# Patient Record
Sex: Male | Born: 1945 | Race: White | Hispanic: No
Health system: Southern US, Community
[De-identification: ages and names within clinical notes are randomized; demographics above are authoritative.]

---

## 2011-08-02 ENCOUNTER — Encounter: Payer: Self-pay | Admitting: Family Medicine

## 2011-08-29 ENCOUNTER — Encounter: Payer: Self-pay | Admitting: Family Medicine

## 2011-09-28 ENCOUNTER — Encounter: Payer: Self-pay | Admitting: Family Medicine

## 2011-10-27 ENCOUNTER — Ambulatory Visit: Payer: Self-pay | Admitting: Surgery

## 2011-11-09 ENCOUNTER — Ambulatory Visit: Payer: Self-pay | Admitting: Gastroenterology

## 2011-11-11 LAB — PATHOLOGY REPORT

## 2012-03-12 ENCOUNTER — Encounter: Payer: Self-pay | Admitting: Family Medicine

## 2012-03-14 ENCOUNTER — Ambulatory Visit: Payer: Self-pay | Admitting: Family Medicine

## 2012-03-18 ENCOUNTER — Ambulatory Visit: Payer: Self-pay | Admitting: Internal Medicine

## 2012-03-30 ENCOUNTER — Encounter: Payer: Self-pay | Admitting: Family Medicine

## 2012-04-29 ENCOUNTER — Encounter: Payer: Self-pay | Admitting: Family Medicine

## 2014-03-12 IMAGING — CR DG LUMBAR SPINE AP/LAT/OBLIQUES W/ FLEX AND EXT
1 series · 5 of 5 positions shown · non-contrast
Comparison: none

REASON FOR EXAM: pain
COMMENTS:

PROCEDURE:     DXR - DXR LUMBAR SPINE WITH OBLIQUES  - March 14, 2012  [DATE]
RESULT:     Lumbar spine images demonstrate preservation of alignment. The
vertebral body heights appear to be normal. The disc spaces are maintained.
Some L4-L5 and L5-S1 degenerative changes are noted.

[Series 1: t lumbar spine ap · 0.14mm/px · 5 of 5 slices shown]
[im 1/5]
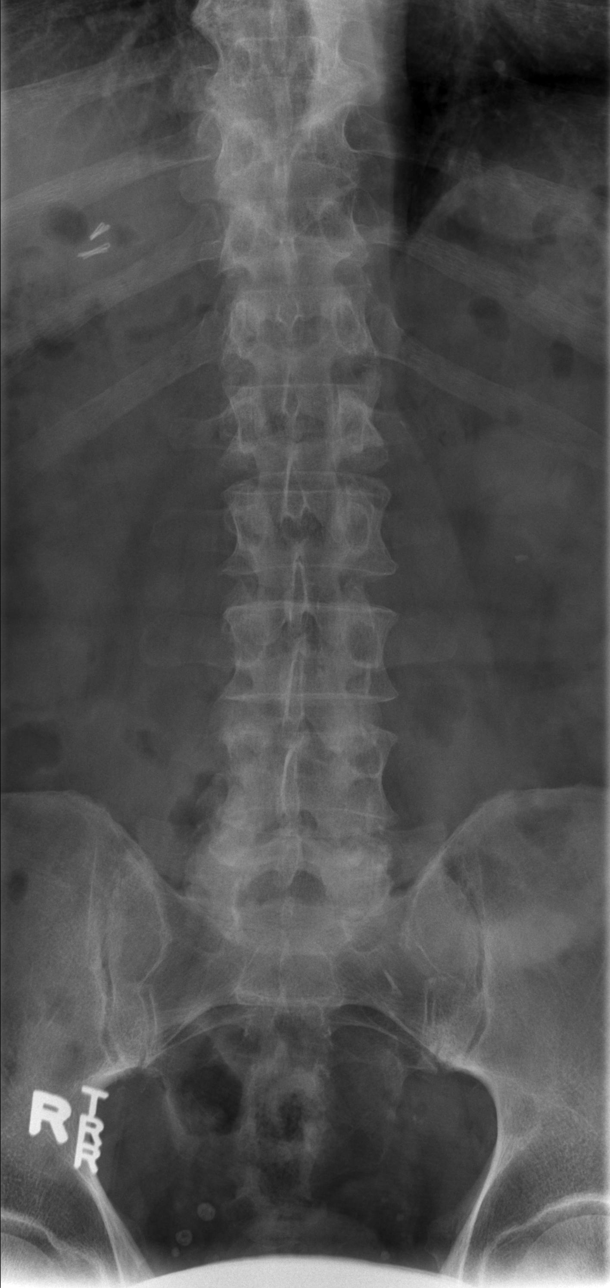
[im 2/5]
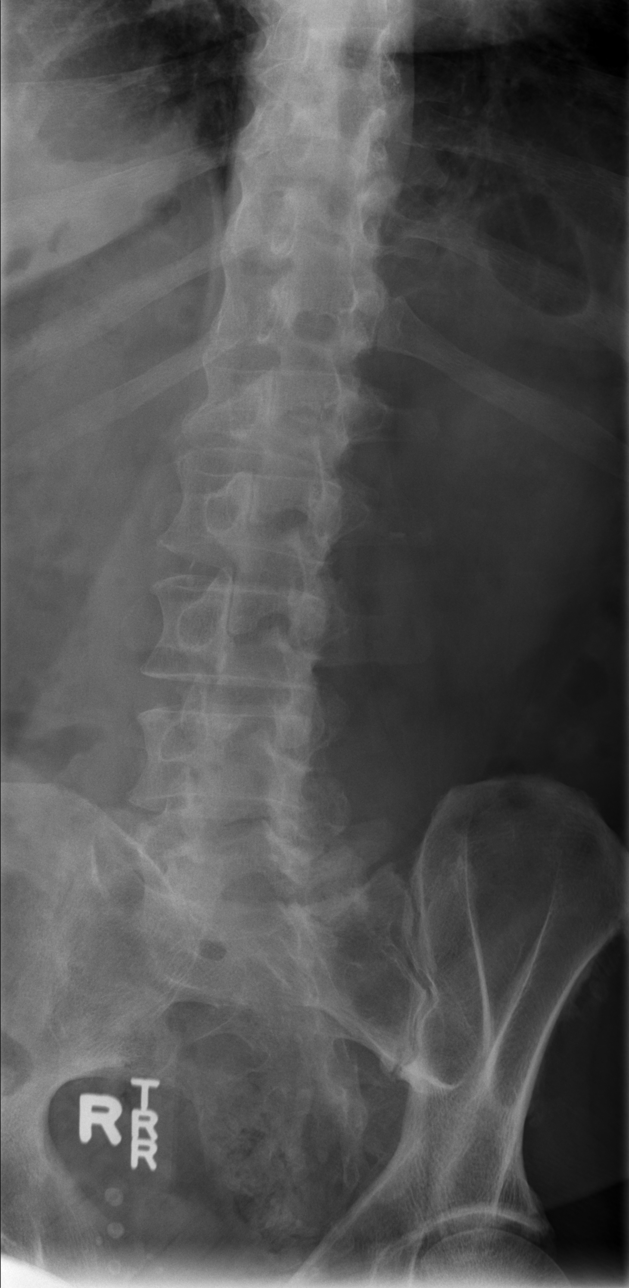
[im 3/5]
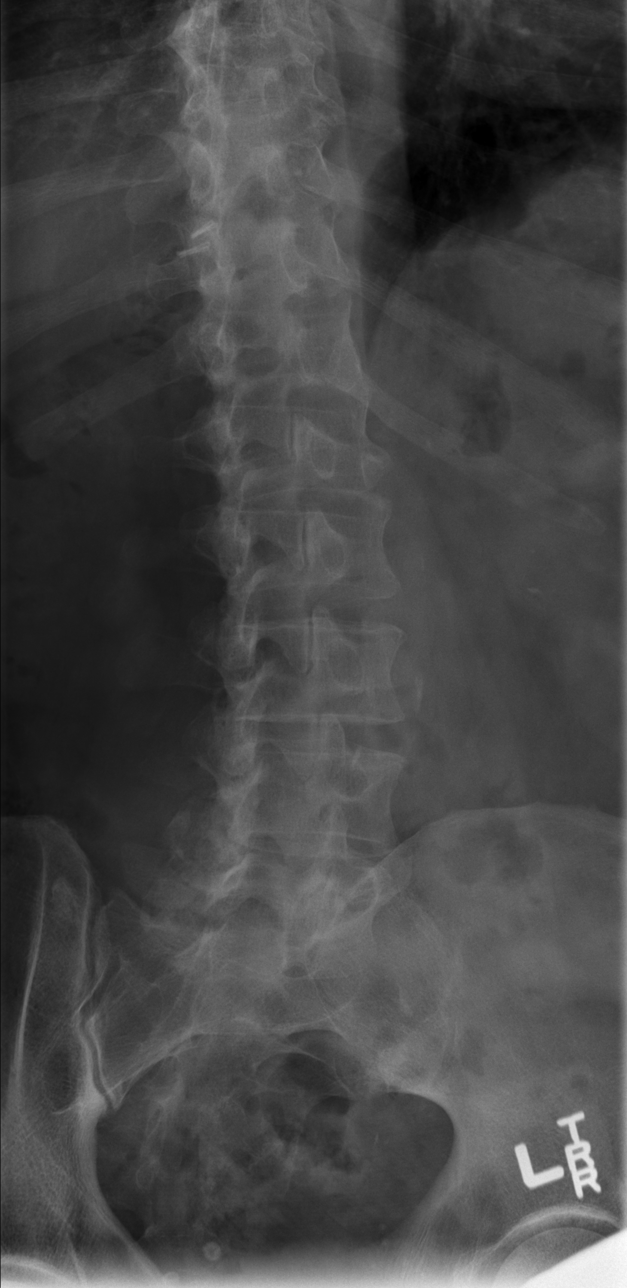
[im 4/5]
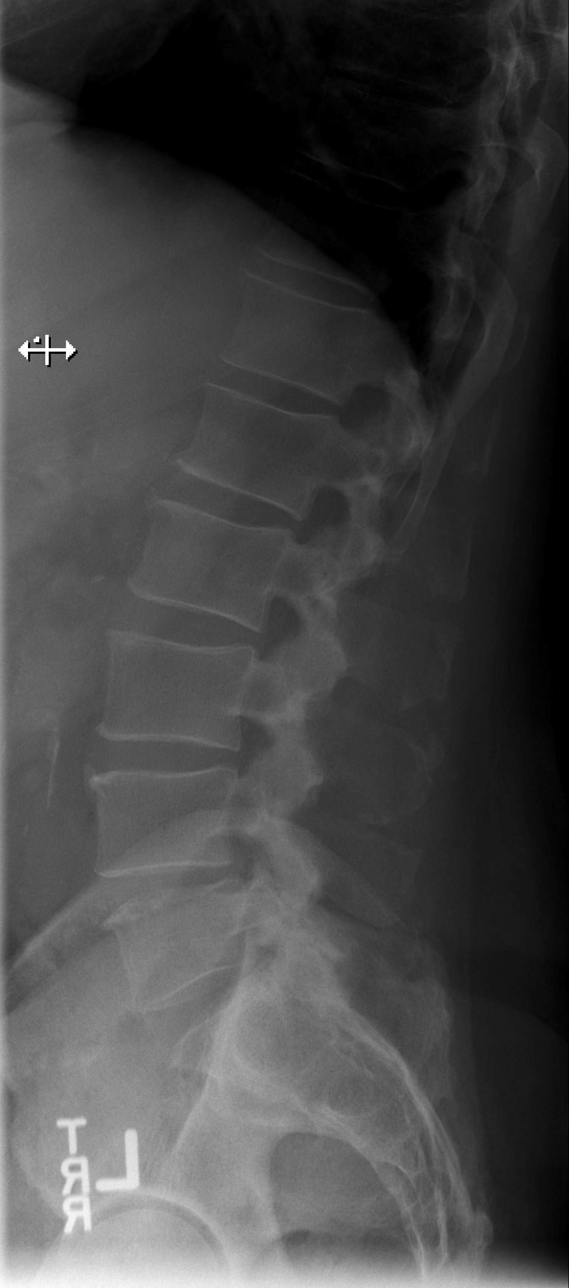
[im 5/5]
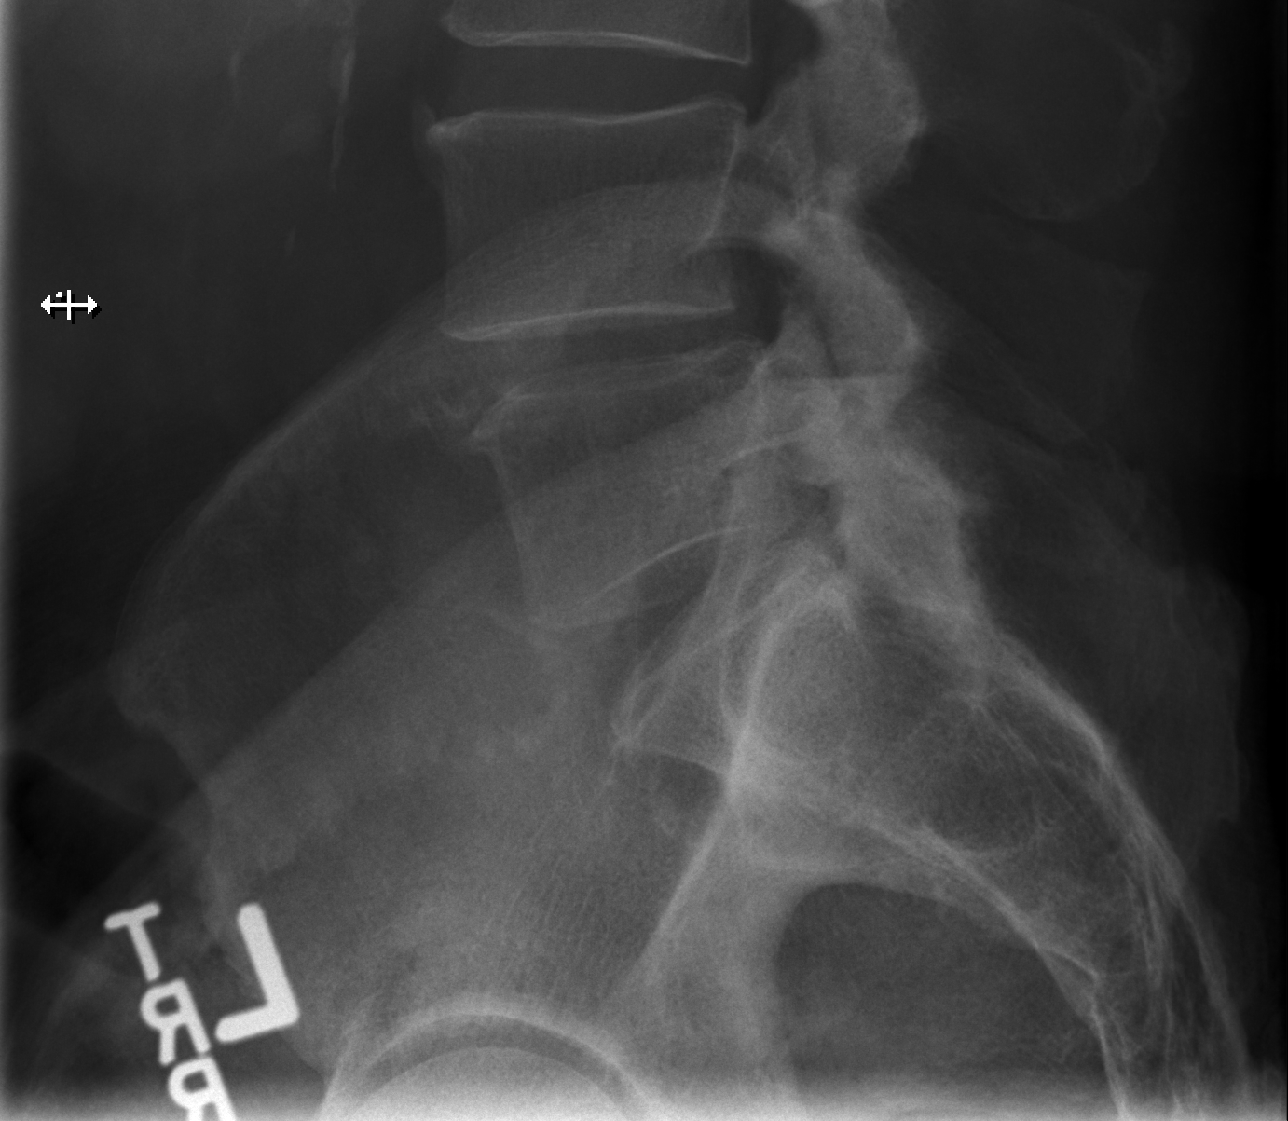

[5 of 5 positions shown; findings below may reference images not displayed]

IMPRESSION: No acute lumbar spine bony abnormality. MRI and CT are
available for for further assessment if desired.

[REDACTED]

## 2014-09-21 NOTE — Op Note (Signed)
PATIENT NAME:  Jaime RuaDOUGLASS, Yomar E MR#:  [id number] DATE OF BIRTH:  [birth year]  DATE OF PROCEDURE:  10/27/2011  PREOPERATIVE DIAGNOSIS: Chronic cholecystitis, cholelithiasis, umbilical hernia.   POSTOPERATIVE DIAGNOSIS: Chronic cholecystitis, cholelithiasis, umbilical hernia.   PROCEDURE: Laparoscopic cholecystectomy, cholangiogram, umbilical hernia repair.   SURGEON: Renda RollsWilton Smith, M.D.   ANESTHESIA: General.   INDICATIONS: This 69 year old male has history of bloating sensation in the abdomen and also has diarrhea for some eight months. He had gastroenterology evaluation. He had ultrasound findings of gallstones. Also, has had steatorrhea and gallbladder surgery was recommended as a part of his treatment. He also had physical findings of an umbilical hernia which was in the upper aspect of the umbilicus and we elected to repair that at the same operative session. We also discussed that laparoscopic cholecystectomy may or may not have any effect on his diarrhea.   DESCRIPTION OF PROCEDURE:  The patient was placed on the operating table in the supine position under general endotracheal anesthesia. The abdomen was prepared with ChloraPrep and draped in a sterile manner. A short incision was made in the upper aspect of the umbilicus at the site of his umbilical hernia and carried down through subcutaneous tissues to encounter an umbilical hernia sac which was dissected free from surrounding structures. The sac was approximately 2 cm in length, was dissected down through the fascial ring defect and was then inverted. The fascia was elevated with a laryngeal hook. A Veress needle was inserted and then inserted the 10 mm cannula and also inserted the 10 mm 0 degree laparoscope.   It was noted at this time the patient had bradycardia and was placed in Trendelenburg position. He was given 0.8 mg of atropine and fairly promptly after that the pulse rate went up above 60 and blood pressure returned to  normal. Subsequently, the abdomen was insufflated with carbon dioxide and identified the liver which appeared normal. Another incision was made in the epigastrium slightly to the right of the midline to introduce an 11 mm cannula. Two incisions were made in the lateral aspect of the right upper quadrant to introduce two 5-mm cannulas.   The gallbladder appeared to be encased in fat and was retracted towards the right shoulder. The infundibulum was retracted inferiorly and laterally. The porta hepatis was demonstrated. Next, dissection was carried down into the fatty tissues surrounding the gallbladder and identified the cystic duct which was dissected free from surrounding structures. The neck of the gallbladder was mobilized with incision of the visceral peritoneum. The cystic artery was dissected free from surrounding structures. A critical view of safety was demonstrated. An Endo Clip was placed across the cystic artery proximally and distally and divided, which allowed better traction on the cystic duct. Next, the Endo Clip was placed across the cystic duct adjacent to the neck of the gallbladder. An incision was made in the cystic duct to introduce a Reddick catheter. Half-strength Conray-60 dye was injected as the cholangiogram was done with fluoroscopy demonstrating the biliary tree and prompt flow of dye into the duodenum. No retained stones were seen. The Reddick catheter was removed. The cystic duct was doubly ligated with Endoclips and divided. The gallbladder was dissected free from the liver with hook and cautery. There was some scant drainage of bile from the gallbladder which was aspirated. The gallbladder was completely separated. The site was irrigated with heparinized saline solution and aspirated. Hemostasis was intact. The gallbladder was placed into an EndoCatch bag and brought up through the  supraumbilical site. It was necessary to lengthen the fascial defect in a superior direction by about  7 mm to allow removal of the gallbladder with this thickened wall and a hard stone and all were submitted in formalin for routine pathology. Next, the right upper quadrant was further inspected. Hemostasis was intact. The cannulas were removed. Carbon dioxide was allowed to escape from the peritoneal cavity.   Next, the umbilical hernia repair was carried out with interrupted 0 Maxon inverted simple sutures, closing the fascial defect and it looked good. All skin incisions were closed with interrupted 5-0 chromic subcuticular sutures, benzoin, and Steri-Strips. Dressings were applied with paper tape. The patient tolerated surgery satisfactorily and was then prepared for transfer to the recovery room.  ____________________________ Shela Commons. Renda Rolls, MD jws:ap D: 10/27/2011 13:11:29 ET T: 10/27/2011 13:37:52 ET JOB#: [id number]  cc: Adella Hare, MD, <Dictator> Adella Hare MD ELECTRONICALLY SIGNED 10/28/2011 18:42
